# Patient Record
Sex: Male | Born: 1955 | Race: White | Hispanic: No | Marital: Single
Health system: Southern US, Community
[De-identification: ages and names within clinical notes are randomized; demographics above are authoritative.]

---

## 2005-01-11 ENCOUNTER — Ambulatory Visit (HOSPITAL_COMMUNITY): Admission: RE | Admit: 2005-01-11 | Discharge: 2005-01-11 | Payer: Self-pay | Admitting: Gastroenterology

## 2005-01-11 ENCOUNTER — Encounter (INDEPENDENT_AMBULATORY_CARE_PROVIDER_SITE_OTHER): Payer: Self-pay | Admitting: Specialist

## 2006-07-31 ENCOUNTER — Encounter: Admission: RE | Admit: 2006-07-31 | Discharge: 2006-07-31 | Payer: Self-pay | Admitting: Gastroenterology

## 2009-12-21 ENCOUNTER — Emergency Department (HOSPITAL_COMMUNITY): Admission: EM | Admit: 2009-12-21 | Discharge: 2009-12-21 | Payer: Self-pay | Admitting: Emergency Medicine

## 2009-12-24 ENCOUNTER — Emergency Department (HOSPITAL_COMMUNITY): Admission: EM | Admit: 2009-12-24 | Discharge: 2009-12-24 | Payer: Self-pay | Admitting: Family Medicine

## 2009-12-28 ENCOUNTER — Emergency Department (HOSPITAL_COMMUNITY): Admission: EM | Admit: 2009-12-28 | Discharge: 2009-12-28 | Payer: Self-pay | Admitting: Family Medicine

## 2010-01-04 ENCOUNTER — Emergency Department (HOSPITAL_COMMUNITY): Admission: EM | Admit: 2010-01-04 | Discharge: 2010-01-04 | Payer: Self-pay | Admitting: Family Medicine

## 2011-02-08 NOTE — Op Note (Signed)
NAME:  JW, COVIN              ACCOUNT NO.:  [id number]   MEDICAL RECORD NO.:  [id number]          PATIENT TYPE:  AMB   LOCATION:  ENDO                         FACILITY:  [facility]   PHYSICIAN:  Graylin Shiver, M.D.   DATE OF BIRTH:  08-06-1956   DATE OF PROCEDURE:  01/11/2005  DATE OF DISCHARGE:                                 OPERATIVE REPORT   PROCEDURE:  Colonoscopy with polypectomy and biopsy.   INDICATIONS FOR PROCEDURE:  The patient is a 55 year old male with a family  history of colon cancer in the region of the appendix in his brother, who  was diagnosed at age 25.  The patient is undergoing colonoscopy to evaluate.   Informed consent was obtained after explanation of the risks of bleeding,  infection, and perforation.   PREMEDICATION:  1.  Fentanyl 75 mcg IV.  2.  Versed 8 mg IV.   PROCEDURE:  With the patient in the left lateral decubitus position, a  rectal exam was performed.  No masses were felt.  The Olympus colonoscope  was inserted into the rectum and advanced around the colon to the cecum.  Cecal landmarks were identified.  The cecum looked normal.  In the ascending  colon, there was a small 3 mm polyp biopsied off with cold forceps.  The  transverse colon looked normal.  In the descending colon, there was a 5 mm  sessile polyp snared with a mini snare and removed by snare cautery  technique.  The sigmoid and rectum looked normal.  He tolerated the  procedure well without complications.   IMPRESSION:  Colon polyps.   PLAN:  The pathology will be checked.      SFG/MEDQ  D:  01/11/2005  T:  01/11/2005  Job:  [id number]   cc:   C. Duane Lope, M.D.  [address]  [place]  [place] [address]  Fax: [phone]

## 2016-12-09 DIAGNOSIS — Z0000 Encounter for general adult medical examination without abnormal findings: Secondary | ICD-10-CM | POA: Diagnosis not present

## 2016-12-16 DIAGNOSIS — E785 Hyperlipidemia, unspecified: Secondary | ICD-10-CM | POA: Diagnosis not present

## 2016-12-16 DIAGNOSIS — N529 Male erectile dysfunction, unspecified: Secondary | ICD-10-CM | POA: Diagnosis not present

## 2016-12-30 DIAGNOSIS — Z1211 Encounter for screening for malignant neoplasm of colon: Secondary | ICD-10-CM | POA: Diagnosis not present

## 2016-12-30 DIAGNOSIS — Z86010 Personal history of colonic polyps: Secondary | ICD-10-CM | POA: Diagnosis not present

## 2016-12-30 DIAGNOSIS — D126 Benign neoplasm of colon, unspecified: Secondary | ICD-10-CM | POA: Diagnosis not present

## 2017-01-02 DIAGNOSIS — D126 Benign neoplasm of colon, unspecified: Secondary | ICD-10-CM | POA: Diagnosis not present

## 2017-01-06 ENCOUNTER — Other Ambulatory Visit: Payer: Self-pay | Admitting: Dermatology

## 2017-01-06 DIAGNOSIS — D485 Neoplasm of uncertain behavior of skin: Secondary | ICD-10-CM | POA: Diagnosis not present

## 2017-01-06 DIAGNOSIS — D225 Melanocytic nevi of trunk: Secondary | ICD-10-CM | POA: Diagnosis not present

## 2017-01-06 DIAGNOSIS — L821 Other seborrheic keratosis: Secondary | ICD-10-CM | POA: Diagnosis not present

## 2017-01-06 DIAGNOSIS — L814 Other melanin hyperpigmentation: Secondary | ICD-10-CM | POA: Diagnosis not present

## 2017-01-06 DIAGNOSIS — D1801 Hemangioma of skin and subcutaneous tissue: Secondary | ICD-10-CM | POA: Diagnosis not present

## 2017-01-23 DIAGNOSIS — G4700 Insomnia, unspecified: Secondary | ICD-10-CM | POA: Diagnosis not present

## 2017-04-14 DIAGNOSIS — Z872 Personal history of diseases of the skin and subcutaneous tissue: Secondary | ICD-10-CM | POA: Diagnosis not present

## 2017-04-14 DIAGNOSIS — L905 Scar conditions and fibrosis of skin: Secondary | ICD-10-CM | POA: Diagnosis not present

## 2017-08-12 DIAGNOSIS — R0789 Other chest pain: Secondary | ICD-10-CM | POA: Diagnosis not present

## 2017-08-12 DIAGNOSIS — E7800 Pure hypercholesterolemia, unspecified: Secondary | ICD-10-CM | POA: Diagnosis not present

## 2017-08-12 DIAGNOSIS — Z7689 Persons encountering health services in other specified circumstances: Secondary | ICD-10-CM | POA: Diagnosis not present

## 2017-12-15 DIAGNOSIS — E785 Hyperlipidemia, unspecified: Secondary | ICD-10-CM | POA: Diagnosis not present

## 2017-12-15 DIAGNOSIS — N529 Male erectile dysfunction, unspecified: Secondary | ICD-10-CM | POA: Diagnosis not present

## 2017-12-15 DIAGNOSIS — R7301 Impaired fasting glucose: Secondary | ICD-10-CM | POA: Diagnosis not present

## 2017-12-15 DIAGNOSIS — Z0000 Encounter for general adult medical examination without abnormal findings: Secondary | ICD-10-CM | POA: Diagnosis not present

## 2018-04-14 DIAGNOSIS — L814 Other melanin hyperpigmentation: Secondary | ICD-10-CM | POA: Diagnosis not present

## 2018-04-14 DIAGNOSIS — L905 Scar conditions and fibrosis of skin: Secondary | ICD-10-CM | POA: Diagnosis not present

## 2018-04-14 DIAGNOSIS — D1801 Hemangioma of skin and subcutaneous tissue: Secondary | ICD-10-CM | POA: Diagnosis not present

## 2018-04-14 DIAGNOSIS — D229 Melanocytic nevi, unspecified: Secondary | ICD-10-CM | POA: Diagnosis not present

## 2019-04-15 DIAGNOSIS — Z20828 Contact with and (suspected) exposure to other viral communicable diseases: Secondary | ICD-10-CM | POA: Diagnosis not present

## 2021-11-28 DIAGNOSIS — Z125 Encounter for screening for malignant neoplasm of prostate: Secondary | ICD-10-CM | POA: Diagnosis not present

## 2021-11-28 DIAGNOSIS — Z136 Encounter for screening for cardiovascular disorders: Secondary | ICD-10-CM | POA: Diagnosis not present

## 2021-11-28 DIAGNOSIS — E559 Vitamin D deficiency, unspecified: Secondary | ICD-10-CM | POA: Diagnosis not present

## 2021-11-28 DIAGNOSIS — R7303 Prediabetes: Secondary | ICD-10-CM | POA: Diagnosis not present

## 2021-12-03 DIAGNOSIS — Z0000 Encounter for general adult medical examination without abnormal findings: Secondary | ICD-10-CM | POA: Diagnosis not present

## 2021-12-03 DIAGNOSIS — I10 Essential (primary) hypertension: Secondary | ICD-10-CM | POA: Diagnosis not present

## 2021-12-03 DIAGNOSIS — R7303 Prediabetes: Secondary | ICD-10-CM | POA: Diagnosis not present

## 2021-12-03 DIAGNOSIS — E559 Vitamin D deficiency, unspecified: Secondary | ICD-10-CM | POA: Diagnosis not present

## 2021-12-04 ENCOUNTER — Other Ambulatory Visit: Payer: Self-pay | Admitting: Family Medicine

## 2021-12-04 DIAGNOSIS — Z0000 Encounter for general adult medical examination without abnormal findings: Secondary | ICD-10-CM

## 2021-12-26 ENCOUNTER — Ambulatory Visit
Admission: RE | Admit: 2021-12-26 | Discharge: 2021-12-26 | Disposition: A | Discharge status: Home / Self Care | Payer: Self-pay | Source: Ambulatory Visit | Attending: Family Medicine | Admitting: Family Medicine

## 2021-12-26 DIAGNOSIS — Z0000 Encounter for general adult medical examination without abnormal findings: Secondary | ICD-10-CM

## 2021-12-26 DIAGNOSIS — Z136 Encounter for screening for cardiovascular disorders: Secondary | ICD-10-CM | POA: Diagnosis not present

## 2022-04-10 DIAGNOSIS — R351 Nocturia: Secondary | ICD-10-CM | POA: Diagnosis not present

## 2022-04-10 DIAGNOSIS — N401 Enlarged prostate with lower urinary tract symptoms: Secondary | ICD-10-CM | POA: Diagnosis not present

## 2022-04-10 DIAGNOSIS — R972 Elevated prostate specific antigen [PSA]: Secondary | ICD-10-CM | POA: Diagnosis not present

## 2022-05-23 DIAGNOSIS — D123 Benign neoplasm of transverse colon: Secondary | ICD-10-CM | POA: Diagnosis not present

## 2022-05-23 DIAGNOSIS — K649 Unspecified hemorrhoids: Secondary | ICD-10-CM | POA: Diagnosis not present

## 2022-05-23 DIAGNOSIS — Z09 Encounter for follow-up examination after completed treatment for conditions other than malignant neoplasm: Secondary | ICD-10-CM | POA: Diagnosis not present

## 2022-05-23 DIAGNOSIS — Z86010 Personal history of colonic polyps: Secondary | ICD-10-CM | POA: Diagnosis not present

## 2022-05-23 DIAGNOSIS — D122 Benign neoplasm of ascending colon: Secondary | ICD-10-CM | POA: Diagnosis not present

## 2022-05-28 DIAGNOSIS — D122 Benign neoplasm of ascending colon: Secondary | ICD-10-CM | POA: Diagnosis not present

## 2022-05-28 DIAGNOSIS — D123 Benign neoplasm of transverse colon: Secondary | ICD-10-CM | POA: Diagnosis not present

## 2022-08-06 ENCOUNTER — Ambulatory Visit (INDEPENDENT_AMBULATORY_CARE_PROVIDER_SITE_OTHER): Payer: Medicare Other

## 2022-08-06 ENCOUNTER — Ambulatory Visit: Payer: Medicare Other | Admitting: Podiatry

## 2022-08-06 DIAGNOSIS — M19072 Primary osteoarthritis, left ankle and foot: Secondary | ICD-10-CM

## 2022-08-06 DIAGNOSIS — M778 Other enthesopathies, not elsewhere classified: Secondary | ICD-10-CM

## 2022-08-06 DIAGNOSIS — M79672 Pain in left foot: Secondary | ICD-10-CM

## 2022-08-06 MED ORDER — MELOXICAM 15 MG PO TABS: 15.0000 mg | ORAL_TABLET | Freq: Every day | ORAL | 0 refills | Status: AC | PRN

## 2022-08-06 NOTE — Progress Notes (Signed)
Subjective:   Patient ID: Corey Mendez, male   DOB: 66 y.o.   MRN: [id number]   HPI Chief Complaint  Patient presents with   Foot Pain    Left foot pain at the top of the foot, hurts when bending the foot, started hurts a week ago, rate of pain 6 out of 10, TX: pain meds    66 year old male presents the office today with the above concerns.  About 1 week he had to do a lot of bending and he was having pain to the toe area.  If he bends his toes some it will hurt some but not bad. It has been oingoing for about 1 year. No injuries. 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 No swelling. No numbness or tingling.    No recent treatment.    Review of Systems  All other systems reviewed and are negative.   No past medical history on file.    Current Outpatient Medications:    meloxicam (MOBIC) 15 MG tablet, Take 1 tablet (15 mg total) by mouth daily as needed for pain., Disp: 30 tablet, Rfl: 0  Not on File       Objective:  Physical Exam  General: AAO x3, NAD  Dermatological: Skin is warm, dry and supple bilateral. There are no open sores, no preulcerative lesions, no rash or signs of infection present.  Vascular: Dorsalis Pedis artery and Posterior Tibial artery pedal pulses are 2/4 bilateral with immedate capillary fill time.  There is no pain with calf compression, swelling, warmth, erythema.   Neruologic: Grossly intact via light touch bilateral.   Musculoskeletal: Decreased range of motion of first MPJ.  Pain on dorsiflexion of the first MPJ and range of motion.  Mild edema of the first MPJ there is no erythema or warmth.  Gait: Unassisted, Nonantalgic.       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  3 views left foot were obtained.  Noted acute fracture.  Decreased joint space along the first MPJ. -Discussed with conservative as well as surgical treatment options.  Continue with conservative care for now.  Prescribe meloxicam.  Discussed shoes and good arch supports.  Discussed wearing stiffer soled shoe.  Also consider custom orthotics with a first ray cut out.  Steroid injection if needed.  Vivi Barrack DPM

## 2022-08-06 NOTE — Patient Instructions (Signed)
Meloxicam Tablets What is this medication? MELOXICAM (mel OX i cam) treats mild to moderate pain, inflammation, or arthritis. It works by decreasing inflammation. It belongs to a group of medications called NSAIDs. This medicine may be used for other purposes; ask your health care provider or pharmacist if you have questions. COMMON BRAND NAME(S): Mobic What should I tell my care team before I take this medication? They need to know if you have any of these conditions: Asthma (lung or breathing disease) Bleeding disorder Coronary artery bypass graft (CABG) within the past 2 weeks Dehydration Frequently drink alcohol Heart attack Heart disease Heart failure High blood pressure Kidney disease Liver disease Stomach bleeding Stomach ulcers, other stomach or intestine problems Take medications that treat or prevent blood clots Taking other steroids, such as dexamethasone or prednisone Tobacco use An unusual or allergic reaction to meloxicam, other medications, foods, dyes, or preservatives Pregnant or trying to get pregnant Breast-feeding How should I use this medication? Take this medication by mouth. Take it as directed on the prescription label at the same time every day. You can take it with or without food. If it upsets your stomach, take it with food. Do not use it more often than directed. There may be unused or extra doses in the bottle after you finish your treatment. Talk to your care team if you have questions about your dose. A special MedGuide will be given to you by the pharmacist with each prescription and refill. Be sure to read this information carefully each time. Talk to your care team about the use of this medication in children. Special care may be needed. People over 27 years of age may have a stronger reaction and need a smaller dose. Overdosage: If you think you have taken too much of this medicine contact a poison control center or emergency room at once. NOTE:  This medicine is only for you. Do not share this medicine with others. What if I miss a dose? If you miss a dose, take it as soon as you can. If it is almost time for your next dose, take only that dose. Do not take double or extra doses. What may interact with this medication? Do not take this medication with any of the following: Cidofovir Ketorolac This medication may also interact with the following: Alcohol Aspirin and aspirin-like medications Certain medications for blood pressure, heart disease, irregular heart beat Certain medications for mental health conditions Certain medications that treat or prevent blood clots, such as warfarin, enoxaparin, dalteparin, apixaban, dabigatran, rivaroxaban Cyclosporine Diuretics Fluconazole Lithium Methotrexate Other NSAIDs, medications for pain and inflammation, such as ibuprofen and naproxen Pemetrexed This list may not describe all possible interactions. Give your health care provider a list of all the medicines, herbs, non-prescription drugs, or dietary supplements you use. Also tell them if you smoke, drink alcohol, or use illegal drugs. Some items may interact with your medicine. What should I watch for while using this medication? Visit your care team for regular checks on your progress. Tell your care team if your symptoms do not start to get better or if they get worse. Do not take other medications that contain aspirin, ibuprofen, or naproxen with this medication. Side effects such as stomach upset, nausea, or ulcers may be more likely to occur. Many non-prescription medications contain aspirin, ibuprofen, or naproxen. Always read labels carefully. This medication can cause serious ulcers and bleeding in the stomach. It can happen with no warning. Tobacco, alcohol, older age, and poor health  can also increase risks. Call your care team right away if you have stomach pain or blood in your vomit or stool. This medication does not prevent a  heart attack or stroke. This medication may increase the chance of a heart attack or stroke. The chance may increase the longer you use this medication or if you have heart disease. If you take aspirin to prevent a heart attack or stroke, talk to your care team about using this medication. This medication may cause serious skin reactions. They can happen weeks to months after starting the medication. Contact your care team right away if you notice fevers or flu-like symptoms with a rash. The rash may be red or purple and then turn into blisters or peeling of the skin. Or, you might notice a red rash with swelling of the face, lips or lymph nodes in your neck or under your arms. Talk to your care team if you wish to become pregnant or think you might be pregnant. This medication can cause serious birth defects. This medication may affect your coordination, reaction time, or judgment. Do not drive or operate machinery until you know how this medication affects you. Sit up or stand slowly to reduce the risk of dizzy or fainting spells. Drinking alcohol with this medication can increase the risk of these side effects. Be careful brushing or flossing your teeth or using a toothpick because you may get an infection or bleed more easily. If you have any dental work done, tell your dentist you are receiving this medication. This medication may make it more difficult to get pregnant. Talk to your care team if you are concerned about your fertility. What side effects may I notice from receiving this medication? Side effects that you should report to your care team as soon as possible: Allergic reactions--skin rash, itching, hives, swelling of the face, lips, tongue, or throat Bleeding--bloody or black, tar-like stools, vomiting blood or brown material that looks like coffee grounds, red or dark brown urine, small red or purple spots on skin, unusual bruising or bleeding Heart attack--pain or tightness in the chest,  shoulders, arms, or jaw, nausea, shortness of breath, cold or clammy skin, feeling faint or lightheaded Heart failure--shortness of breath, swelling of ankles, feet, or hands, sudden weight gain, unusual weakness or fatigue Increase in blood pressure Kidney injury--decrease in the amount of urine, swelling of the ankles, hands, or feet Liver injury--right upper belly pain, loss of appetite, nausea, light-colored stool, dark yellow or brown urine, yellowing skin or eyes, unusual weakness or fatigue Rash, fever, and swollen lymph nodes Redness, blistering, peeling, or loosening of the skin, including inside the mouth Stroke--sudden numbness or weakness of the face, arm, or leg, trouble speaking, confusion, trouble walking, loss of balance or coordination, dizziness, severe headache, change in vision Side effects that usually do not require medical attention (report to your care team if they continue or are bothersome): Diarrhea Nausea Upset stomach This list may not describe all possible side effects. Call your doctor for medical advice about side effects. You may report side effects to FDA at 1-800-FDA-1088. Where should I keep my medication? Keep out of the reach of children and pets. Store at room temperature between 20 and 25 degrees C (68 and 77 degrees F). Protect from moisture. Keep the container tightly closed. Get rid of any unused medication after the expiration date. To get rid of medications that are no longer needed or have expired: Take the medication to a medication  take-back program. Check with your pharmacy or law enforcement to find a location. If you cannot return the medication, check the label or package insert to see if the medication should be thrown out in the garbage or flushed down the toilet. If you are not sure, ask your care team. If it is safe to put it in the trash, empty the medication out of the container. Mix the medication with cat litter, dirt, coffee grounds, or  other unwanted substance. Seal the mixture in a bag or container. Put it in the trash. NOTE: This sheet is a summary. It may not cover all possible information. If you have questions about this medicine, talk to your doctor, pharmacist, or health care provider.  2023 Elsevier/Gold Standard (2021-05-23 00:00:00)

## 2022-08-19 ENCOUNTER — Other Ambulatory Visit: Payer: Self-pay | Admitting: Podiatry

## 2022-08-19 DIAGNOSIS — M778 Other enthesopathies, not elsewhere classified: Secondary | ICD-10-CM

## 2022-08-19 DIAGNOSIS — M19072 Primary osteoarthritis, left ankle and foot: Secondary | ICD-10-CM

## 2022-08-19 DIAGNOSIS — M79672 Pain in left foot: Secondary | ICD-10-CM

## 2022-10-23 IMAGING — US US ABDOMINAL AORTA SCREENING AAA
1 series · 14 of 25 positions shown · non-contrast
Comparison: None.

CLINICAL DATA: Screening.

EXAM:
US ABDOMINAL AORTA MEDICARE SCREENING
TECHNIQUE: Ultrasound examination of the abdominal aorta was performed as a
screening evaluation for abdominal aortic aneurysm.

[Series 1: us abdominal aorta screening aaa · 0.31mm/px · 14 of 25 slices shown]
[im 1/25]
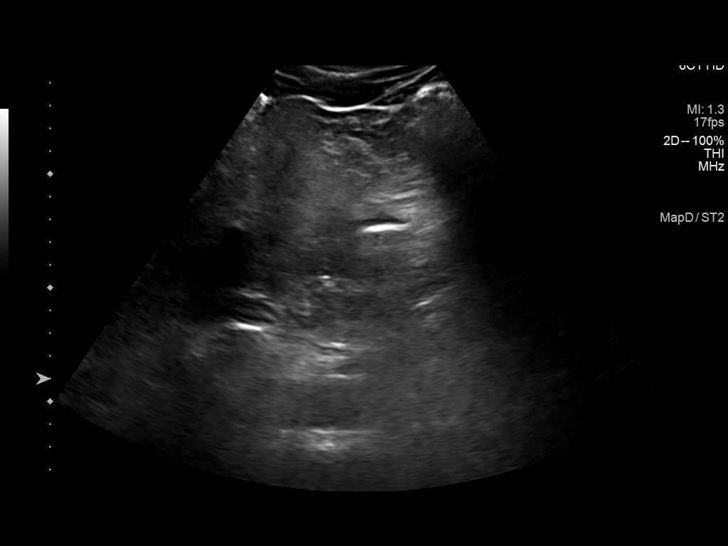
[im 3/25]
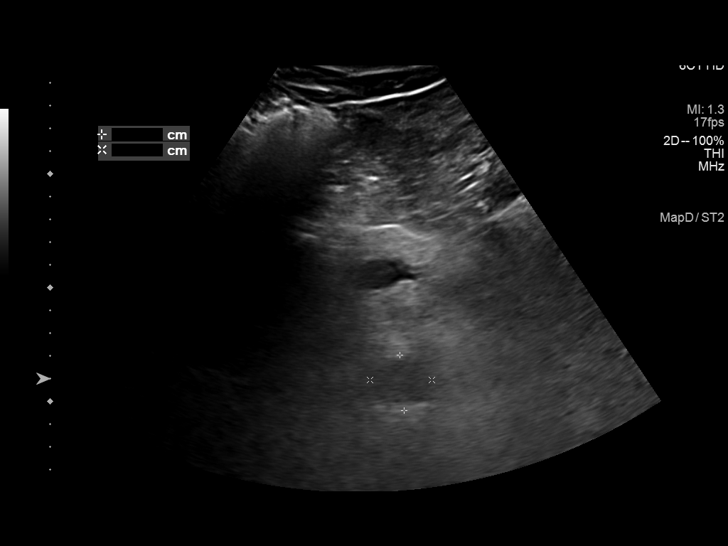
[im 5/25]
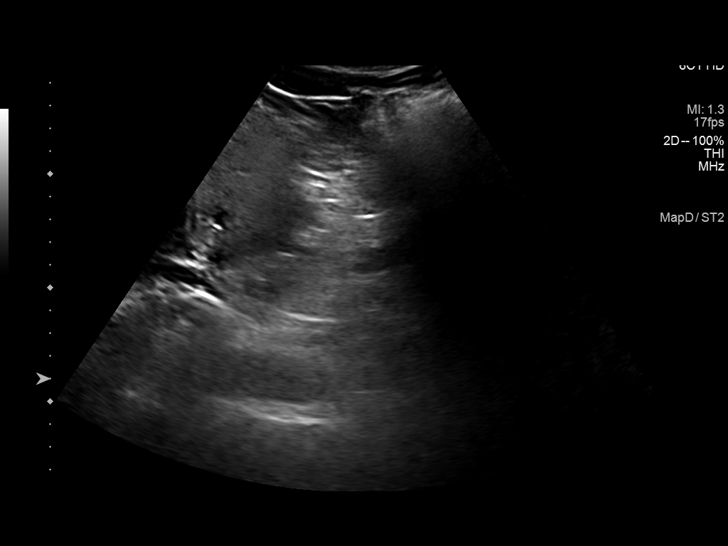
[im 7/25]
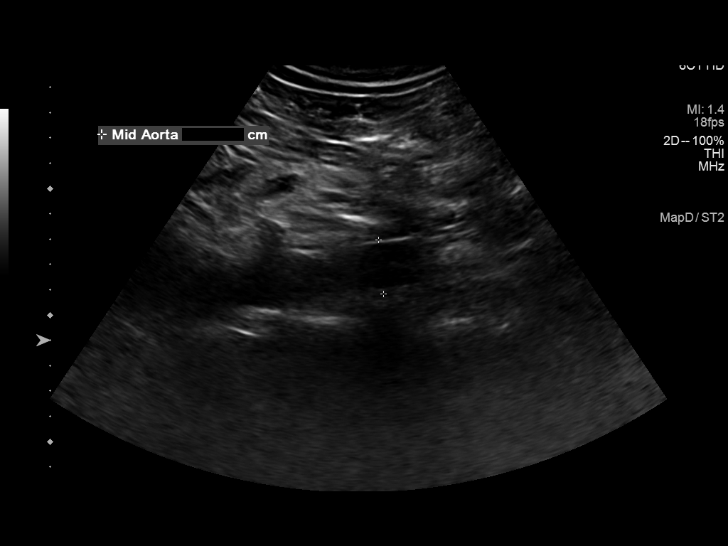
[im 9/25]
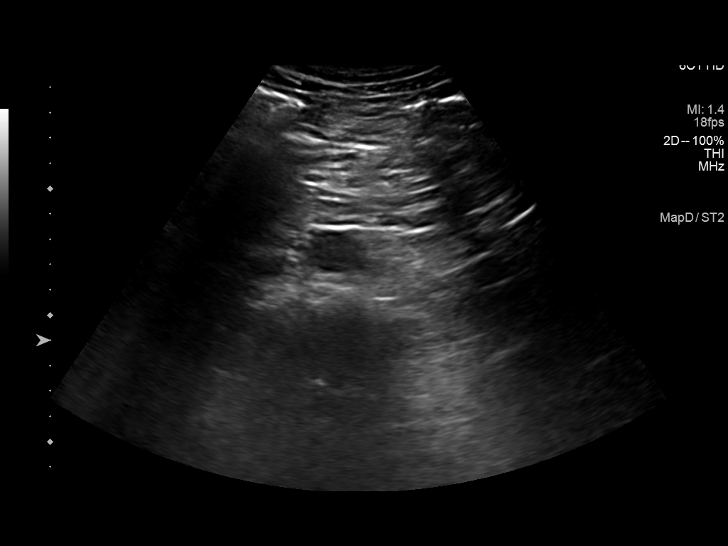
[im 10/25]
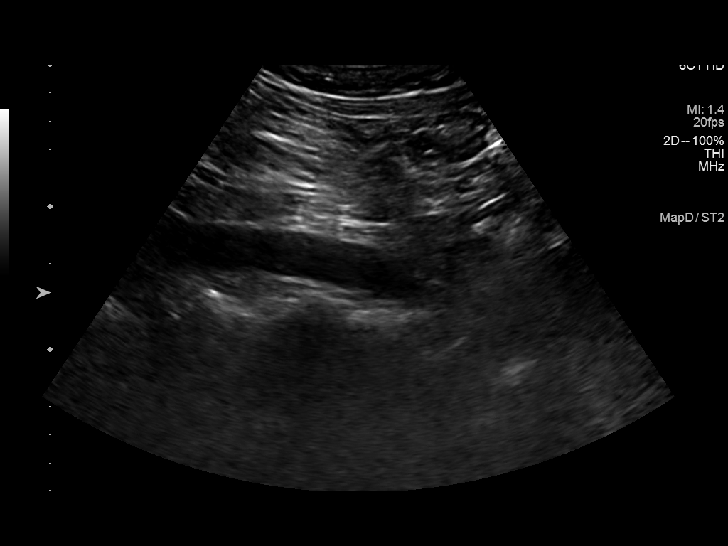
[im 12/25]
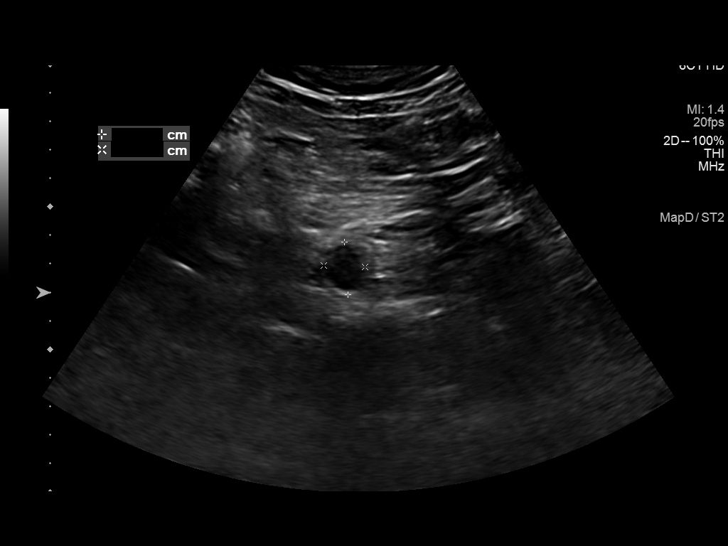
[im 14/25]
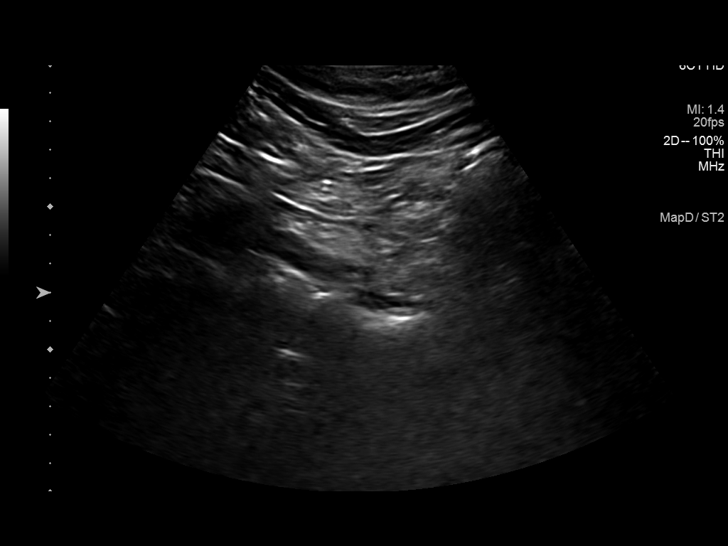
[im 16/25]
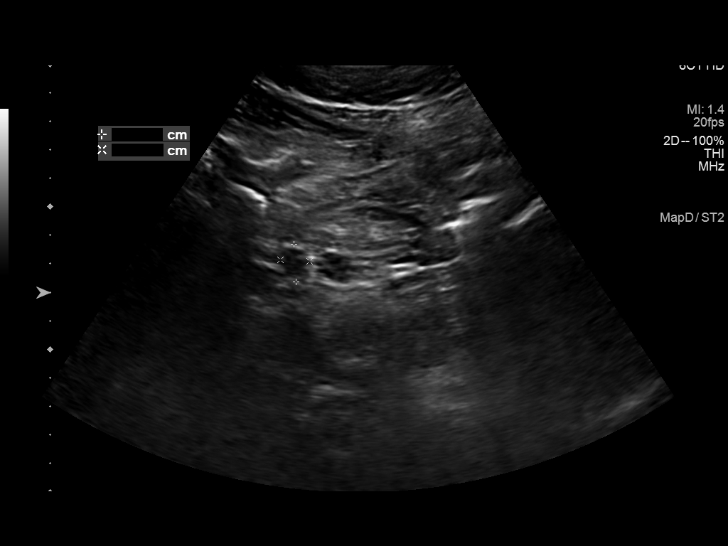
[im 17/25]
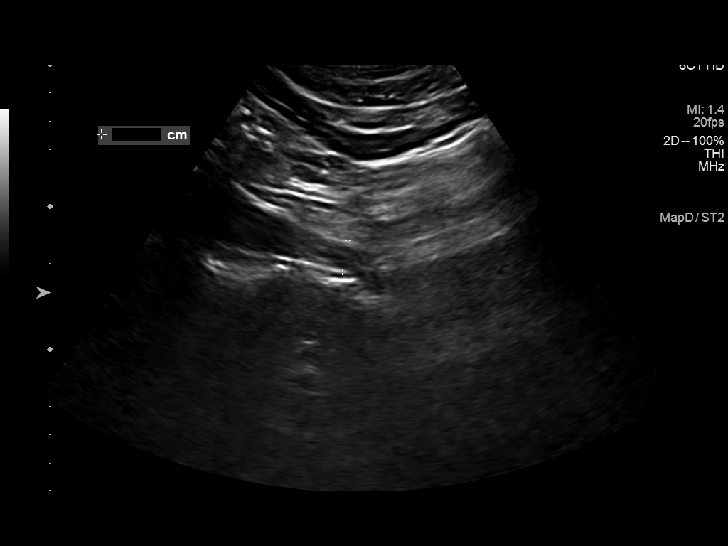
[im 19/25]
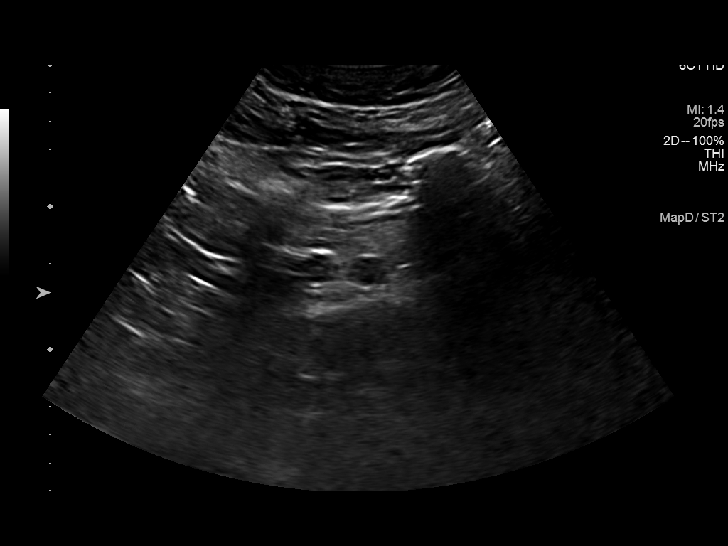
[im 21/25]
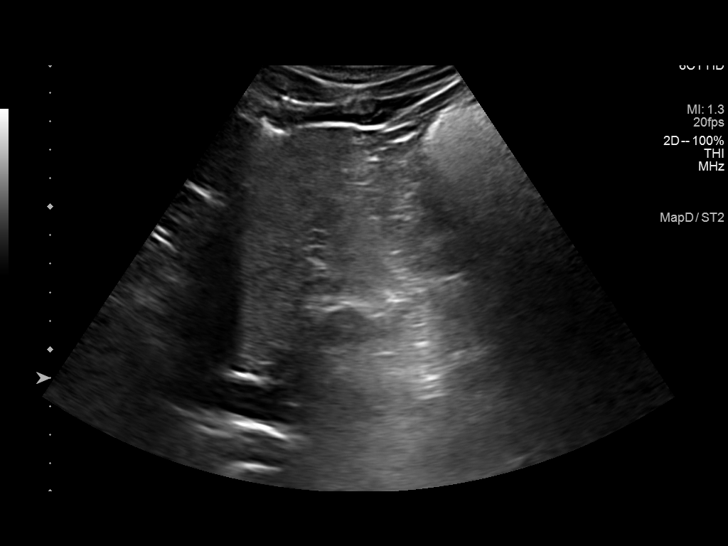
[im 23/25]
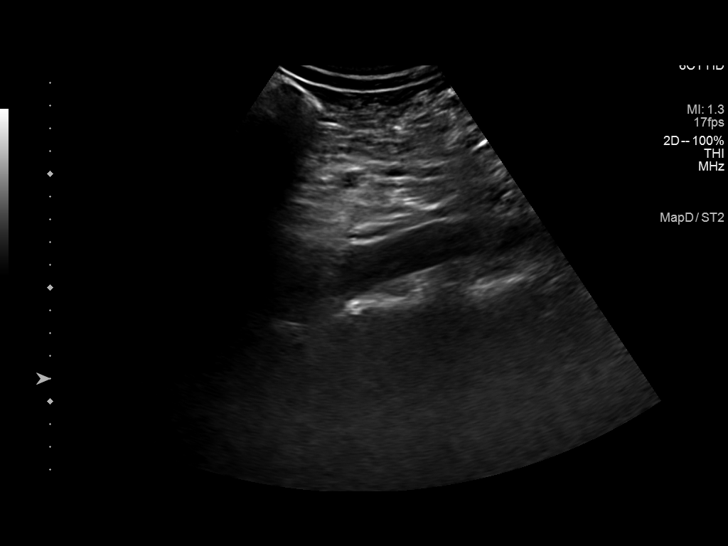
[im 25/25]
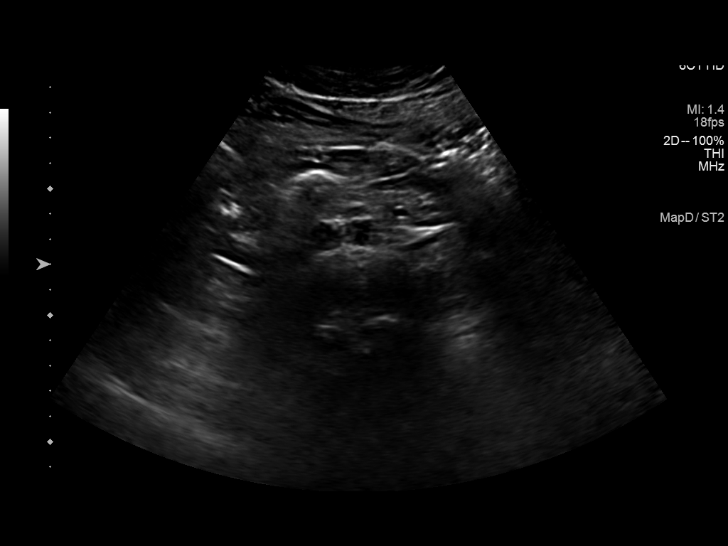

[14 of 25 positions shown; findings below may reference images not displayed]

FINDINGS: Abdominal aortic measurements as follows:

Proximal:  2.6 x 2.7 cm

Mid:  2.1 x 2.3 cm

Distal:  1.9 x 1.5 cm
IMPRESSION: Abdominal aorta measures up to 2.7 cm. Recommend follow-up
ultrasound every 5 years. This recommendation follows ACR consensus
guidelines: White Paper of the ACR Incidental Findings Committee II

## 2022-12-13 DIAGNOSIS — Z0000 Encounter for general adult medical examination without abnormal findings: Secondary | ICD-10-CM | POA: Diagnosis not present

## 2022-12-13 DIAGNOSIS — I10 Essential (primary) hypertension: Secondary | ICD-10-CM | POA: Diagnosis not present

## 2022-12-13 DIAGNOSIS — E559 Vitamin D deficiency, unspecified: Secondary | ICD-10-CM | POA: Diagnosis not present

## 2022-12-13 DIAGNOSIS — R7303 Prediabetes: Secondary | ICD-10-CM | POA: Diagnosis not present

## 2022-12-13 DIAGNOSIS — E7800 Pure hypercholesterolemia, unspecified: Secondary | ICD-10-CM | POA: Diagnosis not present

## 2022-12-13 DIAGNOSIS — Z125 Encounter for screening for malignant neoplasm of prostate: Secondary | ICD-10-CM | POA: Diagnosis not present

## 2022-12-17 DIAGNOSIS — E559 Vitamin D deficiency, unspecified: Secondary | ICD-10-CM | POA: Diagnosis not present

## 2022-12-17 DIAGNOSIS — Z0000 Encounter for general adult medical examination without abnormal findings: Secondary | ICD-10-CM | POA: Diagnosis not present

## 2022-12-17 DIAGNOSIS — I10 Essential (primary) hypertension: Secondary | ICD-10-CM | POA: Diagnosis not present

## 2022-12-17 DIAGNOSIS — E7800 Pure hypercholesterolemia, unspecified: Secondary | ICD-10-CM | POA: Diagnosis not present

## 2022-12-17 DIAGNOSIS — R7303 Prediabetes: Secondary | ICD-10-CM | POA: Diagnosis not present

## 2022-12-31 DIAGNOSIS — R42 Dizziness and giddiness: Secondary | ICD-10-CM | POA: Diagnosis not present

## 2023-01-22 DIAGNOSIS — H524 Presbyopia: Secondary | ICD-10-CM | POA: Diagnosis not present

## 2023-04-03 DIAGNOSIS — K080 Exfoliation of teeth due to systemic causes: Secondary | ICD-10-CM | POA: Diagnosis not present

## 2023-04-21 DIAGNOSIS — K080 Exfoliation of teeth due to systemic causes: Secondary | ICD-10-CM | POA: Diagnosis not present

## 2023-04-22 DIAGNOSIS — K080 Exfoliation of teeth due to systemic causes: Secondary | ICD-10-CM | POA: Diagnosis not present

## 2023-08-25 DIAGNOSIS — J018 Other acute sinusitis: Secondary | ICD-10-CM | POA: Diagnosis not present

## 2023-11-06 DIAGNOSIS — J411 Mucopurulent chronic bronchitis: Secondary | ICD-10-CM | POA: Diagnosis not present

## 2023-11-06 DIAGNOSIS — R059 Cough, unspecified: Secondary | ICD-10-CM | POA: Diagnosis not present

## 2023-11-06 DIAGNOSIS — J329 Chronic sinusitis, unspecified: Secondary | ICD-10-CM | POA: Diagnosis not present

## 2023-12-15 DIAGNOSIS — Z0000 Encounter for general adult medical examination without abnormal findings: Secondary | ICD-10-CM | POA: Diagnosis not present

## 2023-12-15 DIAGNOSIS — Z23 Encounter for immunization: Secondary | ICD-10-CM | POA: Diagnosis not present

## 2023-12-15 DIAGNOSIS — E7800 Pure hypercholesterolemia, unspecified: Secondary | ICD-10-CM | POA: Diagnosis not present

## 2023-12-15 DIAGNOSIS — Z125 Encounter for screening for malignant neoplasm of prostate: Secondary | ICD-10-CM | POA: Diagnosis not present

## 2023-12-15 DIAGNOSIS — I10 Essential (primary) hypertension: Secondary | ICD-10-CM | POA: Diagnosis not present

## 2023-12-15 DIAGNOSIS — E559 Vitamin D deficiency, unspecified: Secondary | ICD-10-CM | POA: Diagnosis not present

## 2023-12-15 DIAGNOSIS — R7303 Prediabetes: Secondary | ICD-10-CM | POA: Diagnosis not present

## 2023-12-15 DIAGNOSIS — Z1331 Encounter for screening for depression: Secondary | ICD-10-CM | POA: Diagnosis not present

## 2023-12-18 DIAGNOSIS — K080 Exfoliation of teeth due to systemic causes: Secondary | ICD-10-CM | POA: Diagnosis not present

## 2023-12-23 DIAGNOSIS — E7800 Pure hypercholesterolemia, unspecified: Secondary | ICD-10-CM | POA: Diagnosis not present

## 2023-12-23 DIAGNOSIS — R972 Elevated prostate specific antigen [PSA]: Secondary | ICD-10-CM | POA: Diagnosis not present

## 2023-12-23 DIAGNOSIS — R7303 Prediabetes: Secondary | ICD-10-CM | POA: Diagnosis not present

## 2023-12-23 DIAGNOSIS — I10 Essential (primary) hypertension: Secondary | ICD-10-CM | POA: Diagnosis not present

## 2023-12-23 DIAGNOSIS — Z0000 Encounter for general adult medical examination without abnormal findings: Secondary | ICD-10-CM | POA: Diagnosis not present

## 2024-01-02 DIAGNOSIS — R972 Elevated prostate specific antigen [PSA]: Secondary | ICD-10-CM | POA: Diagnosis not present

## 2024-01-02 DIAGNOSIS — N401 Enlarged prostate with lower urinary tract symptoms: Secondary | ICD-10-CM | POA: Diagnosis not present

## 2024-01-02 DIAGNOSIS — Z8042 Family history of malignant neoplasm of prostate: Secondary | ICD-10-CM | POA: Diagnosis not present

## 2024-01-02 DIAGNOSIS — R351 Nocturia: Secondary | ICD-10-CM | POA: Diagnosis not present

## 2024-01-06 ENCOUNTER — Encounter: Payer: Self-pay | Admitting: Urology

## 2024-01-06 ENCOUNTER — Other Ambulatory Visit: Payer: Self-pay | Admitting: Urology

## 2024-01-06 DIAGNOSIS — R972 Elevated prostate specific antigen [PSA]: Secondary | ICD-10-CM

## 2024-01-23 ENCOUNTER — Encounter: Payer: Self-pay | Admitting: Urology

## 2024-02-12 ENCOUNTER — Ambulatory Visit
Admission: RE | Admit: 2024-02-12 | Discharge: 2024-02-12 | Disposition: A | Discharge status: Home / Self Care | Source: Ambulatory Visit | Attending: Urology | Admitting: Urology

## 2024-02-12 DIAGNOSIS — R972 Elevated prostate specific antigen [PSA]: Secondary | ICD-10-CM

## 2024-02-12 MED ORDER — GADOPICLENOL 0.5 MMOL/ML IV SOLN
8.0000 mL | Freq: Once | INTRAVENOUS | Status: AC | PRN
  Administered 2024-02-12: 8 mL via INTRAVENOUS

## 2024-03-01 DIAGNOSIS — N401 Enlarged prostate with lower urinary tract symptoms: Secondary | ICD-10-CM | POA: Diagnosis not present

## 2024-03-01 DIAGNOSIS — R351 Nocturia: Secondary | ICD-10-CM | POA: Diagnosis not present

## 2024-03-01 DIAGNOSIS — R972 Elevated prostate specific antigen [PSA]: Secondary | ICD-10-CM | POA: Diagnosis not present

## 2024-03-01 DIAGNOSIS — Z8042 Family history of malignant neoplasm of prostate: Secondary | ICD-10-CM | POA: Diagnosis not present

## 2024-03-08 DIAGNOSIS — R972 Elevated prostate specific antigen [PSA]: Secondary | ICD-10-CM | POA: Diagnosis not present

## 2024-03-08 DIAGNOSIS — N4289 Other specified disorders of prostate: Secondary | ICD-10-CM | POA: Diagnosis not present

## 2024-04-14 DIAGNOSIS — K080 Exfoliation of teeth due to systemic causes: Secondary | ICD-10-CM | POA: Diagnosis not present

## 2024-05-03 DIAGNOSIS — R051 Acute cough: Secondary | ICD-10-CM | POA: Diagnosis not present

## 2024-05-03 DIAGNOSIS — J029 Acute pharyngitis, unspecified: Secondary | ICD-10-CM | POA: Diagnosis not present

## 2024-05-03 DIAGNOSIS — R52 Pain, unspecified: Secondary | ICD-10-CM | POA: Diagnosis not present

## 2024-05-03 DIAGNOSIS — Z03818 Encounter for observation for suspected exposure to other biological agents ruled out: Secondary | ICD-10-CM | POA: Diagnosis not present

## 2024-05-03 DIAGNOSIS — R5383 Other fatigue: Secondary | ICD-10-CM | POA: Diagnosis not present

## 2024-05-05 DIAGNOSIS — Z201 Contact with and (suspected) exposure to tuberculosis: Secondary | ICD-10-CM | POA: Diagnosis not present

## 2024-05-05 DIAGNOSIS — R059 Cough, unspecified: Secondary | ICD-10-CM | POA: Diagnosis not present
